# Patient Record
Sex: Male | Born: 1990 | Race: White | Hispanic: No | Marital: Married | Smoking: Former smoker
Health system: Southern US, Community
[De-identification: ages and names within clinical notes are randomized; demographics above are authoritative.]

## PROBLEM LIST (undated history)

## (undated) DIAGNOSIS — K621 Rectal polyp: Secondary | ICD-10-CM

## (undated) DIAGNOSIS — K589 Irritable bowel syndrome without diarrhea: Secondary | ICD-10-CM

## (undated) HISTORY — DX: Irritable bowel syndrome, unspecified: K58.9

---
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
## 2017-05-24 ENCOUNTER — Encounter: Payer: Self-pay | Admitting: Internal Medicine

## 2017-07-18 ENCOUNTER — Ambulatory Visit: Payer: Self-pay | Admitting: Internal Medicine

## 2019-03-05 ENCOUNTER — Emergency Department (HOSPITAL_COMMUNITY): Payer: BLUE CROSS/BLUE SHIELD

## 2019-03-05 ENCOUNTER — Encounter (HOSPITAL_COMMUNITY): Payer: Self-pay | Admitting: Emergency Medicine

## 2019-03-05 ENCOUNTER — Emergency Department (HOSPITAL_COMMUNITY)
Admission: EM | Admit: 2019-03-05 | Discharge: 2019-03-05 | Disposition: A | Discharge status: Home / Self Care | Payer: BLUE CROSS/BLUE SHIELD | Attending: Emergency Medicine | Admitting: Emergency Medicine

## 2019-03-05 ENCOUNTER — Other Ambulatory Visit: Payer: Self-pay

## 2019-03-05 DIAGNOSIS — F17210 Nicotine dependence, cigarettes, uncomplicated: Secondary | ICD-10-CM | POA: Diagnosis not present

## 2019-03-05 DIAGNOSIS — Z79899 Other long term (current) drug therapy: Secondary | ICD-10-CM | POA: Insufficient documentation

## 2019-03-05 DIAGNOSIS — R140 Abdominal distension (gaseous): Secondary | ICD-10-CM | POA: Insufficient documentation

## 2019-03-05 DIAGNOSIS — R112 Nausea with vomiting, unspecified: Secondary | ICD-10-CM | POA: Insufficient documentation

## 2019-03-05 DIAGNOSIS — R6883 Chills (without fever): Secondary | ICD-10-CM | POA: Insufficient documentation

## 2019-03-05 DIAGNOSIS — R197 Diarrhea, unspecified: Secondary | ICD-10-CM | POA: Diagnosis not present

## 2019-03-05 DIAGNOSIS — R1031 Right lower quadrant pain: Secondary | ICD-10-CM | POA: Insufficient documentation

## 2019-03-05 LAB — COMPREHENSIVE METABOLIC PANEL
ALT: 34 U/L (ref 0–44)
AST: 27 U/L (ref 15–41)
Albumin: 4.8 g/dL (ref 3.5–5.0)
Alkaline Phosphatase: 74 U/L (ref 38–126)
Anion gap: 11 (ref 5–15)
BUN: 11 mg/dL (ref 6–20)
CO2: 26 mmol/L (ref 22–32)
Calcium: 9.2 mg/dL (ref 8.9–10.3)
Chloride: 101 mmol/L (ref 98–111)
Creatinine, Ser: 0.78 mg/dL (ref 0.61–1.24)
GFR calc Af Amer: >60 mL/min (ref >60)
GFR calc non Af Amer: >60 mL/min (ref >60)
Glucose, Bld: 114 mg/dL — ABNORMAL HIGH (ref 70–99)
Potassium: 4.2 mmol/L (ref 3.5–5.1)
Sodium: 138 mmol/L (ref 135–145)
Total Bilirubin: 0.9 mg/dL (ref 0.3–1.2)
Total Protein: 7.9 g/dL (ref 6.5–8.1)

## 2019-03-05 LAB — URINALYSIS, ROUTINE W REFLEX MICROSCOPIC
Bilirubin Urine: NEGATIVE
Glucose, UA: NEGATIVE mg/dL
Hgb urine dipstick: NEGATIVE
Ketones, ur: 80 mg/dL — AB
Leukocytes,Ua: NEGATIVE
Nitrite: NEGATIVE
Protein, ur: NEGATIVE mg/dL
Specific Gravity, Urine: 1.023 (ref 1.005–1.030)
pH: 6 (ref 5.0–8.0)

## 2019-03-05 LAB — CBC
HCT: 48.5 % (ref 39.0–52.0)
Hemoglobin: 16.1 g/dL (ref 13.0–17.0)
MCH: 30.4 pg (ref 26.0–34.0)
MCHC: 33.2 g/dL (ref 30.0–36.0)
MCV: 91.7 fL (ref 80.0–100.0)
Platelets: 252 10*3/uL (ref 150–400)
RBC: 5.29 MIL/uL (ref 4.22–5.81)
RDW: 12.4 % (ref 11.5–15.5)
WBC: 24.9 10*3/uL — ABNORMAL HIGH (ref 4.0–10.5)
nRBC: 0 % (ref 0.0–0.2)

## 2019-03-05 LAB — LIPASE, BLOOD: Lipase: 27 U/L (ref 11–51)

## 2019-03-05 MED ORDER — ONDANSETRON 4 MG PO TBDP: 4.0000 mg | ORAL_TABLET | Freq: Three times a day (TID) | ORAL | 0 refills | Status: DC | PRN

## 2019-03-05 MED ORDER — ONDANSETRON HCL 4 MG/2ML IJ SOLN
4.0000 mg | INTRAMUSCULAR | Status: DC | PRN
  Filled 2019-03-05: qty 2

## 2019-03-05 MED ORDER — DICYCLOMINE HCL 20 MG PO TABS: 20.0000 mg | ORAL_TABLET | Freq: Two times a day (BID) | ORAL | 0 refills | Status: DC

## 2019-03-05 MED ORDER — SODIUM CHLORIDE (PF) 0.9 % IJ SOLN
INTRAMUSCULAR | Status: AC
  Filled 2019-03-05: qty 50

## 2019-03-05 MED ORDER — IOHEXOL 300 MG/ML  SOLN
100.0000 mL | Freq: Once | INTRAMUSCULAR | Status: AC | PRN
  Administered 2019-03-05: 100 mL via INTRAVENOUS

## 2019-03-05 NOTE — ED Triage Notes (Signed)
Pt c/o abd pain with n/v/d that started this morning. 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

## 2019-03-05 NOTE — ED Notes (Signed)
Patient offered a beverage. °

## 2019-03-05 NOTE — Discharge Instructions (Signed)
Please read and follow all provided instructions.  Your diagnoses today include:  1. Nausea vomiting and diarrhea   2. Right lower quadrant abdominal pain     Tests performed today include:  Blood counts and electrolytes - very high white blood cell count  Blood tests to check liver and kidney function  Blood tests to check pancreas function  Urine test to look for infection  CT scan - looks good, no appendicitis or other problems  Vital signs. See below for your results today.   Medications prescribed:   Zofran (ondansetron) - for nausea and vomiting   Bentyl - medication for intestinal cramps and spasms  Take any prescribed medications only as directed.  Home care instructions:   Follow any educational materials contained in this packet.   Your abdominal pain, nausea, vomiting, and diarrhea may be caused by a viral gastroenteritis also called 'stomach flu'. You should rest for the next several days. Keep drinking plenty of fluids and use the medicine for nausea as directed.    Drink clear liquids for the next 24 hours and introduce solid foods slowly after 24 hours using the b.r.a.t. diet (Bananas, Rice, Applesauce, Toast, Yogurt).    Follow-up instructions: Please follow-up with your primary care provider in the next 2 days for further evaluation of your symptoms. If you are not feeling better in 48 hours you may have a condition that is more serious and you need re-evaluation.   Return instructions:  SEEK IMMEDIATE MEDICAL ATTENTION IF:  If you have pain that does not go away or becomes severe   A temperature above 101F develops   Repeated vomiting occurs (multiple episodes)   If you have pain that becomes localized to portions of the abdomen. The right side could possibly be appendicitis. In an adult, the left lower portion of the abdomen could be colitis or diverticulitis.   Blood is being passed in stools or vomit (bright red or black tarry stools)   You  develop chest pain, difficulty breathing, dizziness or fainting, or become confused, poorly responsive, or inconsolable (young children)  If you have any other emergent concerns regarding your health  Additional Information: Abdominal (belly) pain can be caused by many things. Your caregiver performed an examination and possibly ordered blood/urine tests and imaging (CT scan, x-rays, ultrasound). Many cases can be observed and treated at home after initial evaluation in the emergency department. Even though you are being discharged home, abdominal pain can be unpredictable. Therefore, you need a repeated exam if your pain does not resolve, returns, or worsens. Most patients with abdominal pain don't have to be admitted to the hospital or have surgery, but serious problems like appendicitis and gallbladder attacks can start out as nonspecific pain. Many abdominal conditions cannot be diagnosed in one visit, so follow-up evaluations are very important.  Your vital signs today were: BP 130/79    Pulse 84    Temp 98.3 F (36.8 C) (Oral)    Resp 17    Ht 5\' 6"  (1.676 m)    Wt 82.6 kg    SpO2 99%    BMI 29.41 kg/m  If your blood pressure (bp) was elevated above 135/85 this visit, please have this repeated by your doctor within one month. --------------

## 2019-03-05 NOTE — ED Provider Notes (Signed)
5:15 PM Handoff from [place] PA-C at shift change.  Patient with elevated white blood cell count in setting of recent onset nausea, vomiting, and diarrhea with right-sided abdominal pain.  CT ordered.  CT images personally reviewed and interpreted.    Radiology reports no acute findings.  Patient symptoms are now relatively controlled and he appears well.  Abdomen is soft and minimally tender.  Patient has passed oral fluid trial and is ready for discharged home.  Will give prescription for Zofran and Bentyl.  Discussed clear liquid and bland diet.  The patient was urged to return to the Emergency Department immediately with worsening of current symptoms, worsening abdominal pain, persistent vomiting, blood noted in stools, fever, or any other concerns. The patient verbalized understanding.   BP 130/79   Pulse 84   Temp 98.3 F (36.8 C) (Oral)   Resp 17   Ht 5\' 6"  (1.676 m)   Wt 82.6 kg   SpO2 99%   BMI 29.41 kg/m     Carlisle Cater, PA-C 03/05/19 1717    Valarie Merino, MD 03/05/19 1901

## 2019-03-05 NOTE — ED Provider Notes (Signed)
[facility] DEPT Provider Note   CSN: [id number] Arrival date & time: 03/05/19  1327    History   Chief Complaint Chief Complaint  Patient presents with  . Abdominal Pain  . Emesis  . Diarrhea    HPI John Phillips is a 28 y.o. male with history of vaping he is here for evaluation of abdominal pain.  Sudden onset upon waking up today, worsening at around 11:30 AM.  Localized to the umbilical area, radiating to the epigastrium and right lateral abdomen and right flank.  Worse with moving, palpation and eating.  Associated with nausea, nonbilious nonbloody emesis and watery diarrhea.  Diarrhea is non-melanotic, looks like [facility]. Some chills.  No interventions.  No alleviating factors.  No abdominal surgeries.  Over the last couple of years he has had some intermittent abdominal bloating and bouts of diarrhea but since starting probiotics this has significantly improved.  He drinks 3-4 beers nightly and smokes marijuana daily.  No frequent NSAID use. No known acid reflux or ulcers. No associated CP, SOB, ST, congestion, cough, dysuria, hematuria.  HPI  History reviewed. No pertinent past medical history.  There are no active problems to display for this patient.   History reviewed. No pertinent surgical history.      Home Medications    Prior to Admission medications   Medication Sig Start Date End Date Taking? Authorizing Provider  Probiotic Product (PROBIOTIC PO) Take 1 tablet by mouth 2 (two) times a day.   Yes [provider]    Family History No family history on file.  Social History Social History   Tobacco Use  . Smoking status: Current Every Day Smoker    Types: Cigarettes  . Smokeless tobacco: Never Used  Substance Use Topics  . Alcohol use: Yes  . Drug use: Not on file     Allergies   Patient has no known allergies.   Review of Systems Review of Systems  Constitutional: Positive for chills.   Gastrointestinal: Positive for abdominal pain, diarrhea, nausea and vomiting.  All other systems reviewed and are negative.    Physical Exam Updated Vital Signs BP 130/79   Pulse 82   Temp 98.3 F (36.8 C) (Oral)   Resp 17   Ht 5\' 6"  (1.676 m)   Wt 82.6 kg   SpO2 99%   BMI 29.41 kg/m   Physical Exam Vitals signs and nursing note reviewed.  Constitutional:      Appearance: He is well-developed.     Comments: Non toxic.  HENT:     Head: Normocephalic and atraumatic.     Nose: Nose normal.  Eyes:     Conjunctiva/sclera: Conjunctivae normal.  Neck:     Musculoskeletal: Normal range of motion.  Cardiovascular:     Rate and Rhythm: Normal rate and regular rhythm.     Heart sounds: Normal heart sounds.     Comments: 1+ radial and DP pulses bilaterally  Pulmonary:     Effort: Pulmonary effort is normal.     Breath sounds: Normal breath sounds.  Abdominal:     General: Bowel sounds are normal.     Palpations: Abdomen is soft.     Tenderness: There is abdominal tenderness. There is guarding.     Comments: Moderate TTP to right mid abdomen, periumbilical area with guarding. Active BS to lower quadrants. No suprapubic or CVA tenderness. No pulsatility. No abdominal hernia.   Musculoskeletal: Normal range of motion.  Skin:  General: Skin is warm and dry.     Capillary Refill: Capillary refill takes less than 2 seconds.  Neurological:     Mental Status: He is alert.  Psychiatric:        Behavior: Behavior normal.      ED Treatments / Results  Labs (all labs ordered are listed, but only abnormal results are displayed) Labs Reviewed  COMPREHENSIVE METABOLIC PANEL - Abnormal; Notable for the following components:      Result Value   Glucose, Bld 114 (*)    All other components within normal limits  CBC - Abnormal; Notable for the following components:   WBC 24.9 (*)    All other components within normal limits  URINALYSIS, ROUTINE W REFLEX MICROSCOPIC - Abnormal;  Notable for the following components:   Ketones, ur 80 (*)    All other components within normal limits  LIPASE, BLOOD    EKG None  Radiology Ct Abdomen Pelvis W Contrast  Result Date: 03/05/2019 CLINICAL DATA:  Abdominal pain with nausea, vomiting and diarrhea beginning this morning. EXAM: CT ABDOMEN AND PELVIS WITH CONTRAST TECHNIQUE: Multidetector CT imaging of the abdomen and pelvis was performed using the standard protocol following bolus administration of intravenous contrast. CONTRAST:  19mL OMNIPAQUE IOHEXOL 300 MG/ML  SOLN COMPARISON:  None. FINDINGS: Lower chest: Normal. Hepatobiliary: Liver, gallbladder and biliary tree are normal. Pancreas: Normal. Spleen: Normal. Adrenals/Urinary Tract: Adrenal glands are normal. Kidneys normal size without hydronephrosis or nephrolithiasis. Ureters and bladder are normal. Stomach/Bowel: Stomach and small bowel are normal. Appendix is normal. Colon is unremarkable. Vascular/Lymphatic: Normal. Reproductive: Normal. Other: No free fluid or focal inflammatory change. Musculoskeletal: Normal. IMPRESSION: No acute findings in the abdomen/pelvis. Electronically Signed   By: Marin Olp M.D.   On: 03/05/2019 16:14    Procedures Procedures (including critical care time)  Medications Ordered in ED Medications  ondansetron (ZOFRAN) injection 4 mg (has no administration in time range)  sodium chloride (PF) 0.9 % injection (has no administration in time range)  iohexol (OMNIPAQUE) 300 MG/ML solution 100 mL (100 mLs Intravenous Contrast Given 03/05/19 1557)     Initial Impression / Assessment and Plan / ED Course  I have reviewed the triage vital signs and the nursing notes.  Pertinent labs & imaging results that were available during my care of the patient were reviewed by me and considered in my medical decision making (see chart for details).  Clinical Course as of Mar 04 1625  Mon Mar 05, 2019  1501 WBC(!): 24.9 [CG]  1615 Ketones, ur(!): 80  [CG]  1616 WBC(!): 24.9 [CG]    Clinical Course User Index [CG] Kinnie Feil, PA-C       TTP to umbilicus, right mid abdomen with guarding. Leukocytosis. 80 ketones in urine possibly from dehydration. Other than leukocytosis no other SIRS criteria.   ddx includes viral gastroenteritis.  Given umbilical and right mid abd could be early appendicitis vs cholecystitis. Heavy nightly ETOH use possibly pancreatitis. Lower suspicion for SBO, dissection, AAA, renal etiology. Pending CTAP   Final Clinical Impressions(s) / ED Diagnoses   1620: Handed pt off to oncoming EDPA who will f/u on CTPA and determine disposition.  Final diagnoses:  None    ED Discharge Orders    None       Arlean Hopping 03/05/19 1626    Veryl Speak, MD 03/08/19 [phone]

## 2019-07-09 ENCOUNTER — Other Ambulatory Visit (INDEPENDENT_AMBULATORY_CARE_PROVIDER_SITE_OTHER): Payer: BC Managed Care – PPO

## 2019-07-09 ENCOUNTER — Ambulatory Visit: Payer: BC Managed Care – PPO | Admitting: Internal Medicine

## 2019-07-09 ENCOUNTER — Encounter: Payer: Self-pay | Admitting: Internal Medicine

## 2019-07-09 VITALS — BP 112/66 | HR 70 | Temp 98.3°F | Ht 66.0 in | Wt 186.8 lb

## 2019-07-09 DIAGNOSIS — D72829 Elevated white blood cell count, unspecified: Secondary | ICD-10-CM | POA: Diagnosis not present

## 2019-07-09 DIAGNOSIS — R197 Diarrhea, unspecified: Secondary | ICD-10-CM

## 2019-07-09 DIAGNOSIS — R1084 Generalized abdominal pain: Secondary | ICD-10-CM

## 2019-07-09 LAB — CBC WITH DIFFERENTIAL/PLATELET
Basophils Absolute: 0.1 10*3/uL (ref 0.0–0.1)
Basophils Relative: 0.7 % (ref 0.0–3.0)
Eosinophils Absolute: 0.2 10*3/uL (ref 0.0–0.7)
Eosinophils Relative: 2.8 % (ref 0.0–5.0)
HCT: 45.7 % (ref 39.0–52.0)
Hemoglobin: 15.6 g/dL (ref 13.0–17.0)
Lymphocytes Relative: 28.3 % (ref 12.0–46.0)
Lymphs Abs: 2.3 10*3/uL (ref 0.7–4.0)
MCHC: 34 g/dL (ref 30.0–36.0)
MCV: 89.3 fl (ref 78.0–100.0)
Monocytes Absolute: 0.6 10*3/uL (ref 0.1–1.0)
Monocytes Relative: 6.8 % (ref 3.0–12.0)
Neutro Abs: 5.1 10*3/uL (ref 1.4–7.7)
Neutrophils Relative %: 61.4 % (ref 43.0–77.0)
Platelets: 278 10*3/uL (ref 150.0–400.0)
RBC: 5.12 Mil/uL (ref 4.22–5.81)
RDW: 13.1 % (ref 11.5–15.5)
WBC: 8.2 10*3/uL (ref 4.0–10.5)

## 2019-07-09 LAB — SEDIMENTATION RATE: Sed Rate: 6 mm/hr (ref 0–15)

## 2019-07-09 LAB — C-REACTIVE PROTEIN: CRP: <1 mg/dL (ref 0.5–20.0)

## 2019-07-09 MED ORDER — DICYCLOMINE HCL 20 MG PO TABS: 20.0000 mg | ORAL_TABLET | Freq: Four times a day (QID) | ORAL | 0 refills | Status: DC | PRN

## 2019-07-09 NOTE — Progress Notes (Signed)
   John Phillips 28 y.o. 1990/12/03 [id number]  Assessment & Plan:   Encounter Diagnoses  Name Primary?  . Diarrhea, unspecified type Yes  . Generalized abdominal pain   . Leukocytosis, unspecified type     Cause of his problems not clear, main differential would be irritable bowel syndrome versus inflammatory bowel disease.  Will evaluate with colonoscopy.  CBC, sed rate C-reactive protein today.  Further plans pending these results.  Dicyclomine as needed for now.  The risks and benefits as well as alternatives of endoscopic procedure(s) have been discussed and reviewed. All questions answered. The patient agrees to proceed.     Subjective:   Chief Complaint: Diarrhea  HPI The patient is a 28 year old married white man who has a 3-year history of intermittent diarrhea and bloating problems.  Began about 3 years ago, and is perhaps a bit more frequent.  He had a severe episode and went to the emergency department in April where he had a white blood cell count greater than 20,000 but a normal CT of the abdomen and pelvis.  He took dicyclomine after that visit and said that that helped.  He gets these episodes without clear triggers though spicy foods seem to bother it and he has stopped those and is a little bit better lately, and he will be bloated and distended and have watery diarrhea and sometimes abdominal cramping.  Nausea and vomiting is rare but was present with a severe episode which prompted emergency department visit.  No bleeding.  No family history of inflammatory bowel disease.  He does not have mouth ulcers rashes arthritis eye problems.  GI review of systems is otherwise negative.  He does smoke cigarettes vape and use marijuana and drinks about 3 drinks a day.  Wt Readings from Last 3 Encounters:  07/09/19 186 lb 12.8 oz (84.7 kg)  03/05/19 182 lb 3 oz (82.6 kg)    No Known Allergies No outpatient medications have been marked as taking for the 07/09/19 encounter  (Office Visit) with Gatha Mayer, MD.   History reviewed. No pertinent past medical history. History reviewed. No pertinent surgical history. Social History   Social History Narrative   Married   1 son born 03/2018   Works for [employer]   3 alcoholic drinks a day, he smokes cigarettes he vapes and he does use marijuana   family history includes Pancreatitis in his father.   Review of Systems As per HPI.  All other review of systems are negative at this time.  Objective:   Physical Exam @BP  112/66   Pulse 70   Temp 98.3 F (36.8 C) (Temporal)   Ht 5\' 6"  (1.676 m)   Wt 186 lb 12.8 oz (84.7 kg)   BMI 30.15 kg/m @  General:  Well-developed, well-nourished and in no acute distress Eyes:  anicteric. ENT:   Mouth and posterior pharynx free of lesions.  Neck:   supple w/o thyromegaly or mass.  Lungs: Clear to auscultation bilaterally. Heart:  S1S2, no rubs, murmurs, gallops. Abdomen:  soft, non-tender, no hepatosplenomegaly, hernia, or mass and BS+.  Rectal: deferred until colonoscopy Lymph:  no cervical or supraclavicular adenopathy. Extremities:   no edema, cyanosis or clubbing Skin   no rash. tattoos Neuro:  A&O x 3.  Psych:  appropriate mood and  Affect.   Data Reviewed:  See HPI

## 2019-07-09 NOTE — Patient Instructions (Addendum)
You have been scheduled for a colonoscopy. Please follow written instructions given to you at your visit today.  Please pick up your prep supplies at the pharmacy within the next 1-3 days. If you use inhalers (even only as needed), please bring them with you on the day of your procedure. Your physician has requested that you go to www.startemmi.com and enter the access code given to you at your visit today. This web site gives a general overview about your procedure. However, you should still follow specific instructions given to you by our office regarding your preparation for the procedure.   We have sent the following medications to your pharmacy for you to pick up at your convenience: Welcome provider has requested that you go to the basement level for lab work before leaving today. Press "B" on the elevator. The lab is located at the first door on the left as you exit the elevator.   I appreciate the opportunity to care for you. Silvano Rusk, MD, [facility]

## 2019-07-10 NOTE — Progress Notes (Signed)
Labs are fine Please let him know

## 2019-07-13 ENCOUNTER — Encounter: Payer: Self-pay | Admitting: Internal Medicine

## 2019-07-23 ENCOUNTER — Other Ambulatory Visit: Payer: Self-pay

## 2019-07-23 ENCOUNTER — Ambulatory Visit (AMBULATORY_SURGERY_CENTER): Payer: BC Managed Care – PPO | Admitting: Internal Medicine

## 2019-07-23 ENCOUNTER — Encounter: Payer: Self-pay | Admitting: Internal Medicine

## 2019-07-23 VITALS — BP 106/69 | HR 80 | Temp 98.6°F | Resp 22 | Ht 66.0 in | Wt 186.0 lb

## 2019-07-23 DIAGNOSIS — R197 Diarrhea, unspecified: Secondary | ICD-10-CM | POA: Diagnosis not present

## 2019-07-23 DIAGNOSIS — K621 Rectal polyp: Secondary | ICD-10-CM | POA: Diagnosis not present

## 2019-07-23 DIAGNOSIS — K635 Polyp of colon: Secondary | ICD-10-CM | POA: Diagnosis not present

## 2019-07-23 DIAGNOSIS — D128 Benign neoplasm of rectum: Secondary | ICD-10-CM

## 2019-07-23 HISTORY — PX: COLONOSCOPY W/ BIOPSIES: SHX1374

## 2019-07-23 MED ORDER — SODIUM CHLORIDE 0.9 % IV SOLN: 500.0000 mL | Freq: Once | INTRAVENOUS | Status: DC

## 2019-07-23 NOTE — Op Note (Signed)
[facility] Patient Name: John Phillips Procedure Date: 07/23/2019 2:59 PM MRN: [id number] Endoscopist: Gatha Mayer , MD Age: 28 Referring MD:  Date of Birth: [birth year] Gender: Male Account #: [id number] Procedure:                Colonoscopy Indications:              Chronic diarrhea, Clinically significant diarrhea                            of unexplained origin Medicines:                Propofol per Anesthesia, Monitored Anesthesia Care Procedure:                Pre-Anesthesia Assessment:                           - Prior to the procedure, a History and Physical                            was performed, and patient medications and                            allergies were reviewed. The patient's tolerance of                            previous anesthesia was also reviewed. The risks                            and benefits of the procedure and the sedation                            options and risks were discussed with the patient.                            All questions were answered, and informed consent                            was obtained. Prior Anticoagulants: The patient has                            taken no previous anticoagulant or antiplatelet                            agents. ASA Grade Assessment: I - A normal, healthy                            patient. After reviewing the risks and benefits,                            the patient was deemed in satisfactory condition to                            undergo the procedure.  After obtaining informed consent, the colonoscope                            was passed under direct vision. Throughout the                            procedure, the patient's blood pressure, pulse, and                            oxygen saturations were monitored continuously. The                            Colonoscope was introduced through the anus and                            advanced to the the terminal  ileum, with                            identification of the appendiceal orifice and IC                            valve. The colonoscopy was performed without                            difficulty. The patient tolerated the procedure                            well. The quality of the bowel preparation was                            good. The bowel preparation used was Miralax via                            split dose instruction. The terminal ileum,                            ileocecal valve, appendiceal orifice, and rectum                            were photographed. Scope In: 3:04:51 PM Scope Out: 3:17:27 PM Scope Withdrawal Time: 0 hours 10 minutes 18 seconds  Total Procedure Duration: 0 hours 12 minutes 36 seconds  Findings:                 The perianal and digital rectal examinations were                            normal.                           Patchy inflammation, mild in severity and                            characterized by erythema and granularity was found  in the terminal ileum. Biopsies were taken with a                            cold forceps for histology. Verification of patient                            identification for the specimen was done. Estimated                            blood loss was minimal.                           A 1 to 2 mm polyp was found in the rectum. The                            polyp was sessile. The polyp was removed with a                            cold biopsy forceps. Resection and retrieval were                            complete. Verification of patient identification                            for the specimen was done. Estimated blood loss was                            minimal.                           The exam was otherwise without abnormality on                            direct and retroflexion views.                           Biopsies for histology were taken with a cold                             forceps from the ascending colon, transverse colon,                            descending colon, sigmoid colon and rectum for                            evaluation of microscopic colitis. Estimated blood                            loss was minimal. Complications:            No immediate complications. Estimated Blood Loss:     Estimated blood loss was minimal. Impression:               - Ileitis, rule out Crohn's disease. Biopsied.                           -  One 1 to 2 mm polyp in the rectum, removed with a                            cold biopsy forceps. Resected and retrieved.                           - The examination was otherwise normal on direct                            and retroflexion views.                           - Biopsies were taken with a cold forceps from the                            ascending colon, transverse colon, descending                            colon, sigmoid colon and rectum for evaluation of                            microscopic colitis. Recommendation:           - Patient has a contact number available for                            emergencies. The signs and symptoms of potential                            delayed complications were discussed with the                            patient. Return to normal activities tomorrow.                            Written discharge instructions were provided to the                            patient.                           - Resume previous diet.                           - Continue present medications.                           - Await pathology results.                           - No recommendation at this time regarding repeat                            colonoscopy due to young age. Gatha Mayer, MD 07/23/2019 3:28:19 PM This report has been signed electronically.

## 2019-07-23 NOTE — Progress Notes (Signed)
Report given to PACU, vss 

## 2019-07-23 NOTE — Patient Instructions (Addendum)
No obvious changes here.  There was a suggestion of inflammation in the ileum - end of the small intestine. Sometimes that is from the prep. I took biopsies of that and also the colon - which looked normal.  One tiny rectal polyp that I am sure is benign was removed.  Once I see pathology results will contact you .  I appreciate the opportunity to care for you.  Gatha Mayer, MD, [facility]  Please read handouts provided. Continue present medications. Await pathology results.     YOU HAD AN ENDOSCOPIC PROCEDURE TODAY AT [facility]:   Refer to the procedure report that was given to you for any specific questions about what was found during the examination.  If the procedure report does not answer your questions, please call your gastroenterologist to clarify.  If you requested that your care partner not be given the details of your procedure findings, then the procedure report has been included in a sealed envelope for you to review at your convenience later.  YOU SHOULD EXPECT: Some feelings of bloating in the abdomen. Passage of more gas than usual.  Walking can help get rid of the air that was put into your GI tract during the procedure and reduce the bloating. If you had a lower endoscopy (such as a colonoscopy or flexible sigmoidoscopy) you may notice spotting of blood in your stool or on the toilet paper. If you underwent a bowel prep for your procedure, you may not have a normal bowel movement for a few days.  Please Note:  You might notice some irritation and congestion in your nose or some drainage.  This is from the oxygen used during your procedure.  There is no need for concern and it should clear up in a day or so.  SYMPTOMS TO REPORT IMMEDIATELY:   Following lower endoscopy (colonoscopy or flexible sigmoidoscopy):  Excessive amounts of blood in the stool  Significant tenderness or worsening of abdominal pains  Swelling of the abdomen that is new,  acute  Fever of 100F or higher   For urgent or emergent issues, a gastroenterologist can be reached at any hour by calling [phone].   DIET:  We do recommend a small meal at first, but then you may proceed to your regular diet.  Drink plenty of fluids but you should avoid alcoholic beverages for 24 hours.  ACTIVITY:  You should plan to take it easy for the rest of today and you should NOT DRIVE or use heavy machinery until tomorrow (because of the sedation medicines used during the test).    FOLLOW UP: Our staff will call the number listed on your records 48-72 hours following your procedure to check on you and address any questions or concerns that you may have regarding the information given to you following your procedure. If we do not reach you, we will leave a message.  We will attempt to reach you two times.  During this call, we will ask if you have developed any symptoms of COVID 19. If you develop any symptoms (ie: fever, flu-like symptoms, shortness of breath, cough etc.) before then, please call [phone].  If you test positive for Covid 19 in the 2 weeks post procedure, please call and report this information to [place].    If any biopsies were taken you will be contacted by phone or by letter within the next 1-3 weeks.  Please call us at [phone] if you have not heard about  the biopsies in 3 weeks.    SIGNATURES/CONFIDENTIALITY: You and/or your care partner have signed paperwork which will be entered into your electronic medical record.  These signatures attest to the fact that that the information above on your After Visit Summary has been reviewed and is understood.  Full responsibility of the confidentiality of this discharge information lies with you and/or your care-partner.

## 2019-07-23 NOTE — Progress Notes (Addendum)
Pt's states no medical or surgical changes since previsit or office visit.  No egg or soy allergy    jb temp cw vitals

## 2019-07-23 NOTE — Progress Notes (Signed)
Called to room to assist during endoscopic procedure.  Patient ID and intended procedure confirmed with present staff. Received instructions for my participation in the procedure from the performing physician.  

## 2019-07-25 ENCOUNTER — Telehealth: Payer: Self-pay

## 2019-07-25 ENCOUNTER — Telehealth: Payer: Self-pay | Admitting: Internal Medicine

## 2019-07-25 NOTE — Telephone Encounter (Signed)
Letter faxed.

## 2019-07-25 NOTE — Telephone Encounter (Signed)
  Follow up Call-  Call back number 07/23/2019  Post procedure Call Back phone  # [phone]  Permission to leave phone message Yes  Some recent data might be hidden     Patient questions:  Do you have a fever, pain , or abdominal swelling? No. Pain Score  0 *  Have you tolerated food without any problems? Yes.    Have you been able to return to your normal activities? Yes.    Do you have any questions about your discharge instructions: Diet   No. Medications  No. Follow up visit  No.  Do you have questions or concerns about your Care? No.  Actions: * If pain score is 4 or above: No action needed, pain <4.  1. Have you developed a fever since your procedure? no  2.   Have you had an respiratory symptoms (SOB or cough) since your procedure? no  3.   Have you tested positive for COVID 19 since your procedure no  4.   Have you had any family members/close contacts diagnosed with the COVID 19 since your procedure?  no   If yes to any of these questions please route to Joylene Fillmore, RN and Alphonsa Gin, Therapist, sports.

## 2019-09-03 ENCOUNTER — Ambulatory Visit (INDEPENDENT_AMBULATORY_CARE_PROVIDER_SITE_OTHER): Payer: BC Managed Care – PPO | Admitting: Internal Medicine

## 2019-09-03 ENCOUNTER — Encounter: Payer: Self-pay | Admitting: Internal Medicine

## 2019-09-03 ENCOUNTER — Other Ambulatory Visit (INDEPENDENT_AMBULATORY_CARE_PROVIDER_SITE_OTHER): Payer: BC Managed Care – PPO

## 2019-09-03 VITALS — BP 112/82 | HR 97 | Temp 97.8°F | Ht 66.0 in | Wt 191.5 lb

## 2019-09-03 DIAGNOSIS — K580 Irritable bowel syndrome with diarrhea: Secondary | ICD-10-CM

## 2019-09-03 DIAGNOSIS — K529 Noninfective gastroenteritis and colitis, unspecified: Secondary | ICD-10-CM

## 2019-09-03 LAB — IGA: IgA: 240 mg/dL (ref 68–378)

## 2019-09-03 NOTE — Patient Instructions (Signed)
Your provider has requested that you go to the basement level for lab work before leaving today. Press "B" on the elevator. The lab is located at the first door on the left as you exit the elevator.   If your test are negative we want you to do the breath test. We are giving you a kit today so you won't have to come back.   You have been given a testing kit to check for small intestine bacterial overgrowth (SIBO) which is completed by a company named [facility]. Make sure to return your test in the mail using the return mailing label given you along with the kit. Your demographic and insurance information have already been sent to the company and they should be in contact with you over the next week regarding this test. Please keep in mind that you will be getting a call from phone number [phone] or a similar number. If you do not hear from them within this time frame, please call our office at [phone].    I appreciate the opportunity to care for you. Carl Gessner, MD, FACG 

## 2019-09-03 NOTE — Assessment & Plan Note (Addendum)
Continue dicyclomine for now.  See chronic diarrhea assessment and plan

## 2019-09-03 NOTE — Progress Notes (Signed)
   Shalin Drewek Lorson 28 y.o. 03-Dec-1990 [id number]  Assessment & Plan:   Chronic diarrhea I think for the most part inflammatory bowel disease has been well ruled out though we could do things like a capsule study or serologies I will think it is worth it.  I want to exclude celiac disease and if that is negative we have decided to pursue lactulose hydrogen breath test for small intestinal bacterial overgrowth.  We did discuss the possibility of empiric Xifaxan and he prefers the test and treat approach.  Irritable bowel syndrome with diarrhea Continue dicyclomine for now.  See chronic diarrhea assessment and plan     Subjective:   Chief Complaint: Diarrhea and abdominal cramps  HPI The patient returns, colonoscopy in September was unrevealing regarding chronic diarrhea.  He is managing with dicyclomine but if he does not take it he will have a bad day.  He was unable to retrieve it from the work truck 1 day and had a pretty bad spell.  Wt Readings from Last 3 Encounters:  09/03/19 191 lb 8 oz (86.9 kg)  07/23/19 186 lb (84.4 kg)  07/09/19 186 lb 12.8 oz (84.7 kg)   He has not correlated it with alcohol or any other food.  Recall that inflammatory markers CBC chemistries all negative in the past as well as CT scan of the abdomen and pelvis in the emergency department. No Known Allergies Current Meds  Medication Sig  . dicyclomine (BENTYL) 20 MG tablet Take 1 tablet (20 mg total) by mouth every 6 (six) hours as needed for spasms.  Marland Kitchen ibuprofen (ADVIL) 200 MG tablet Take 400 mg by mouth every 6 (six) hours as needed.  . Probiotic Product (PROBIOTIC PO) Take 1 tablet by mouth 2 (two) times a day.   Past Medical History:  Diagnosis Date  . IBS (irritable bowel syndrome)    Past Surgical History:  Procedure Laterality Date  . COLONOSCOPY W/ BIOPSIES  07/23/2019   Social History   Social History Narrative   Married   1 son born 03/2018   Works for [employer]   3 alcoholic  drinks a day, he smokes cigarettes he vapes and he does use marijuana   family history includes Leukemia in his paternal grandfather; Pancreatitis in his father.   Review of Systems As above  Objective:   Physical Exam BP 112/82 (BP Location: Left Arm, Patient Position: Sitting, Cuff Size: Normal)   Pulse 97   Temp 97.8 F (36.6 C) (Other (Comment))   Ht 5\' 6"  (1.676 m)   Wt 191 lb 8 oz (86.9 kg)   BMI 30.91 kg/m  No acute distress  15 minutes time spent with patient > half in counseling coordination of care

## 2019-09-03 NOTE — Assessment & Plan Note (Signed)
I think for the most part inflammatory bowel disease has been well ruled out though we could do things like a capsule study or serologies I will think it is worth it.  I want to exclude celiac disease and if that is negative we have decided to pursue lactulose hydrogen breath test for small intestinal bacterial overgrowth.  We did discuss the possibility of empiric Xifaxan and he prefers the test and treat approach.

## 2019-09-04 LAB — TISSUE TRANSGLUTAMINASE, IGA: (tTG) Ab, IgA: 1 U/mL

## 2019-09-07 NOTE — Progress Notes (Signed)
Celiac testing is negative  He has a lactulose H2 breath test for SIBO and should do that  Will contact when results are in

## 2019-09-18 ENCOUNTER — Other Ambulatory Visit: Payer: Self-pay | Admitting: Internal Medicine

## 2019-09-18 NOTE — Telephone Encounter (Signed)
May I refill Sir? 

## 2019-10-31 ENCOUNTER — Other Ambulatory Visit: Payer: Self-pay | Admitting: Internal Medicine

## 2019-11-07 ENCOUNTER — Telehealth: Payer: Self-pay

## 2019-11-07 NOTE — Telephone Encounter (Signed)
Left detailed message for [name] to call me back. I want to make sure he has a breath test kit as discussed at the last visit on 09/03/2019.

## 2019-12-04 ENCOUNTER — Other Ambulatory Visit: Payer: Self-pay | Admitting: Internal Medicine

## 2020-01-22 ENCOUNTER — Other Ambulatory Visit: Payer: Self-pay | Admitting: Internal Medicine

## 2020-03-11 ENCOUNTER — Other Ambulatory Visit: Payer: Self-pay | Admitting: Internal Medicine

## 2020-04-21 ENCOUNTER — Other Ambulatory Visit: Payer: Self-pay | Admitting: Internal Medicine

## 2020-06-08 ENCOUNTER — Other Ambulatory Visit: Payer: Self-pay | Admitting: Internal Medicine

## 2020-08-08 ENCOUNTER — Other Ambulatory Visit: Payer: Self-pay | Admitting: Internal Medicine

## 2020-10-03 ENCOUNTER — Other Ambulatory Visit: Payer: Self-pay | Admitting: Internal Medicine

## 2020-11-28 IMAGING — CT CT ABDOMEN AND PELVIS WITH CONTRAST
2 of 4 series · 17 of 46 positions shown, 19 images · IV contrast (ISOVUE)
Comparison: None.

CLINICAL DATA: Abdominal pain with nausea, vomiting and diarrhea
beginning this morning.

EXAM:
CT ABDOMEN AND PELVIS WITH CONTRAST
TECHNIQUE: Multidetector CT imaging of the abdomen and pelvis was performed
using the standard protocol following bolus administration of
intravenous contrast.
CONTRAST:  100mL OMNIPAQUE IOHEXOL 300 MG/ML  SOLN

[Series 2: axial st · axial · 0.74mm/px · z∈[-531,-161]mm · 14 of 84 slices shown, 16 images]
[im 5/84  soft-tissue]
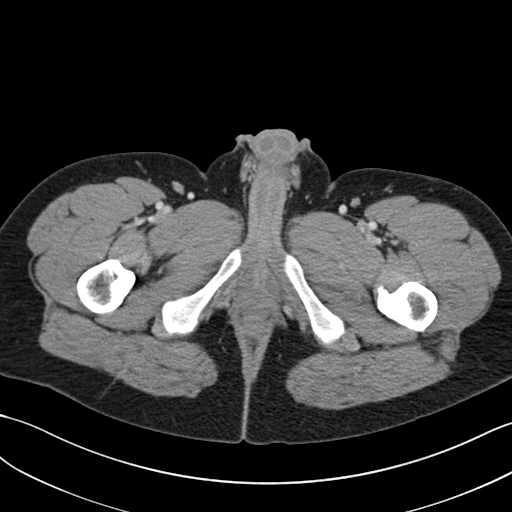
[im 5/84  bone]
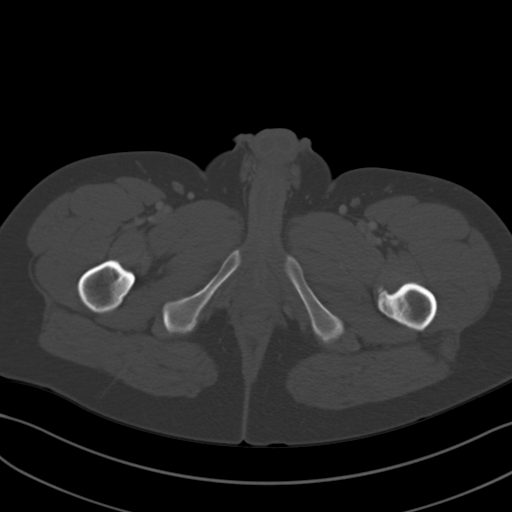
[im 10/84  soft-tissue]
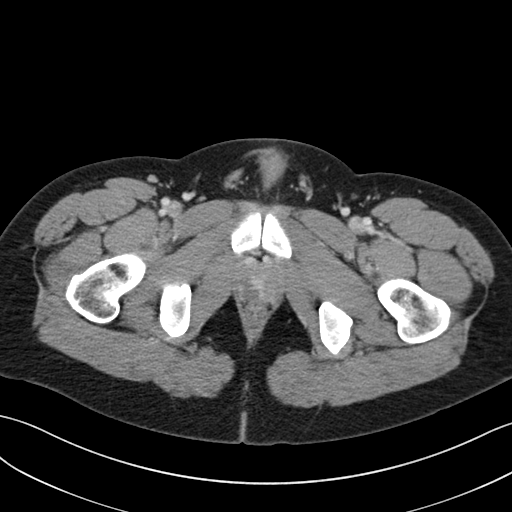
[im 15/84  soft-tissue]
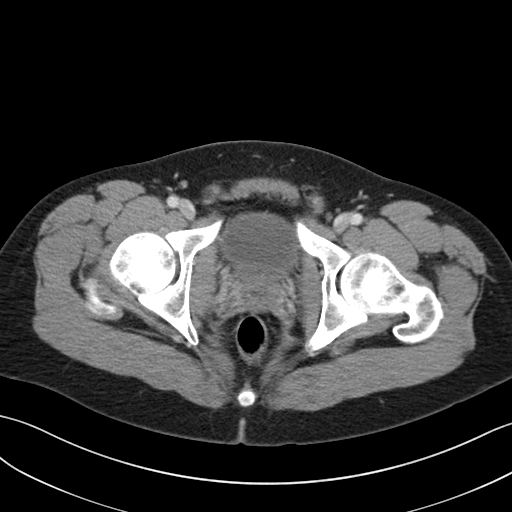
[im 25/84  soft-tissue]
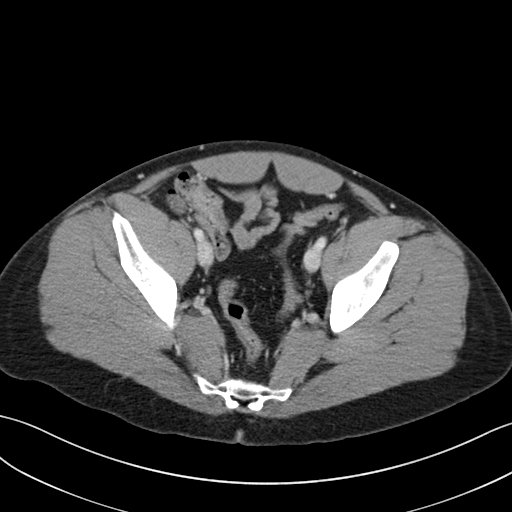
[im 30/84  soft-tissue]
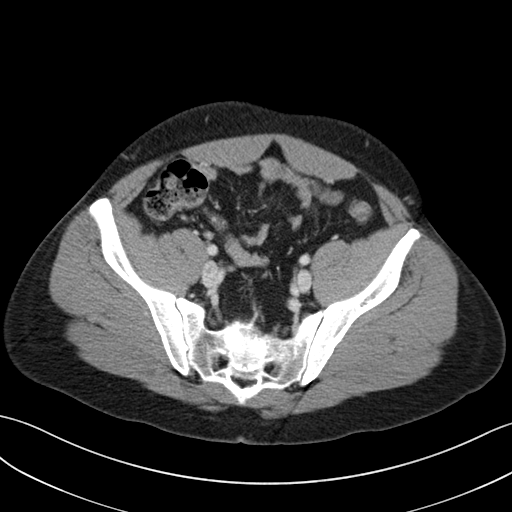
[im 35/84  soft-tissue]
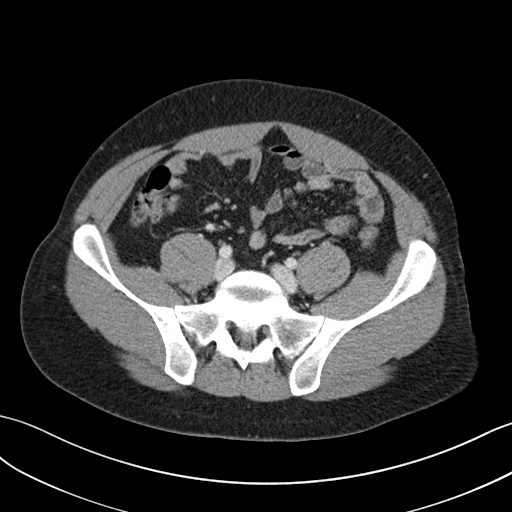
[im 40/84  soft-tissue]
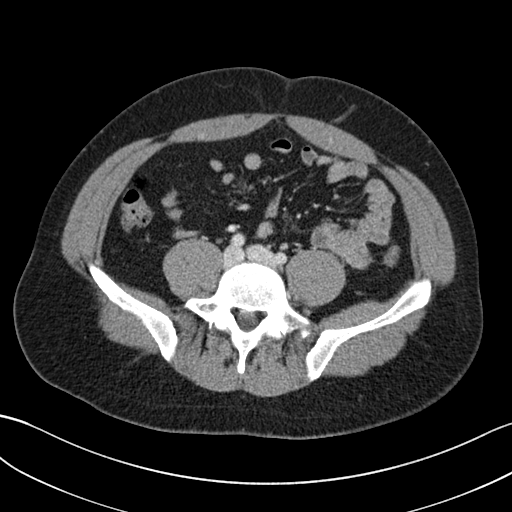
[im 44/84  soft-tissue]
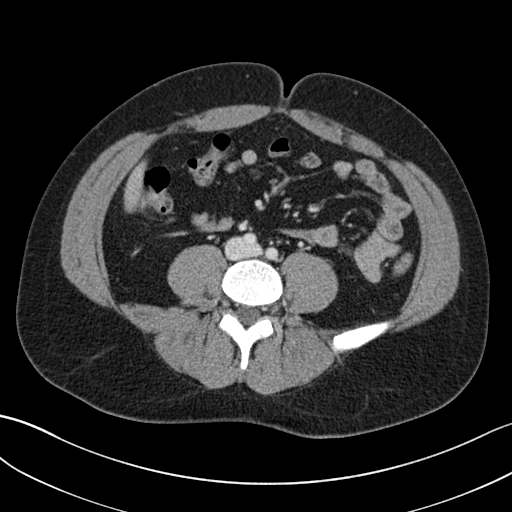
[im 49/84  soft-tissue]
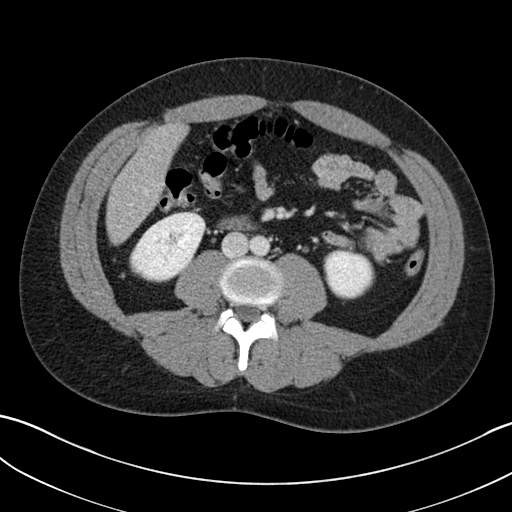
[im 49/84  bone]
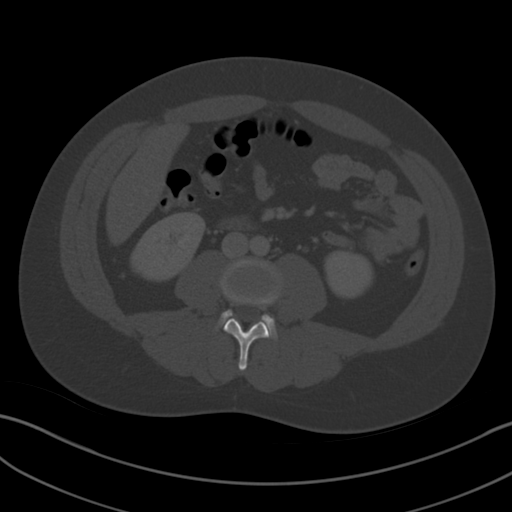
[im 54/84  soft-tissue]
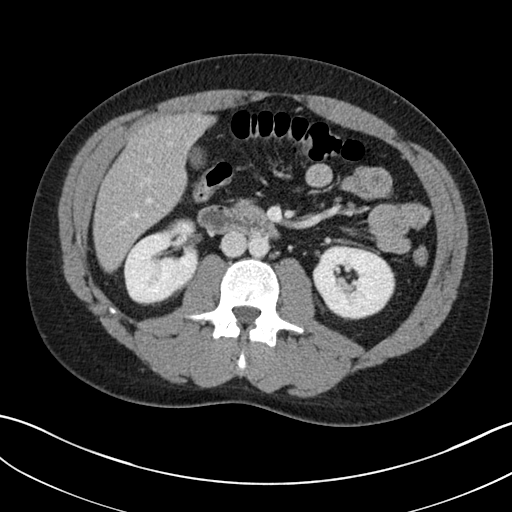
[im 64/84  soft-tissue]
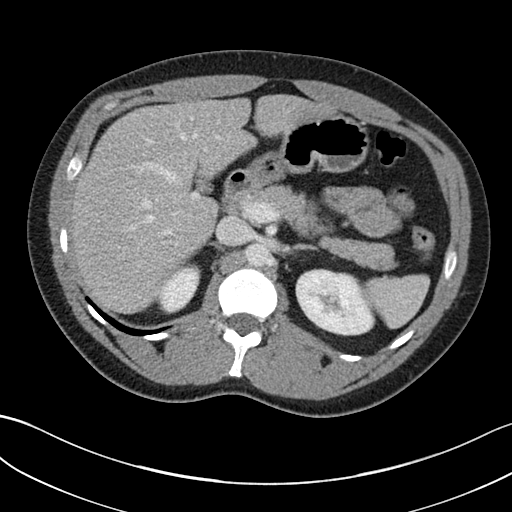
[im 69/84  soft-tissue]
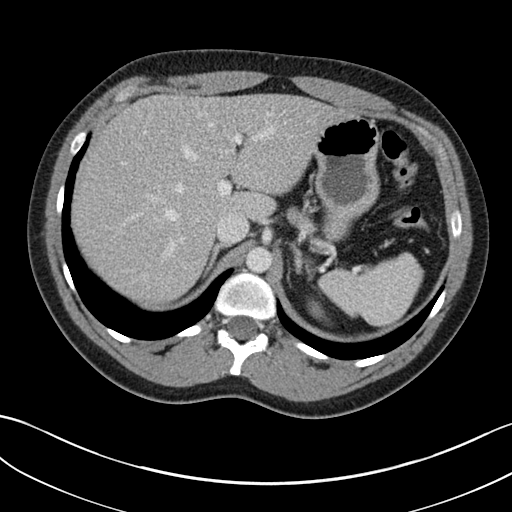
[im 74/84  soft-tissue]
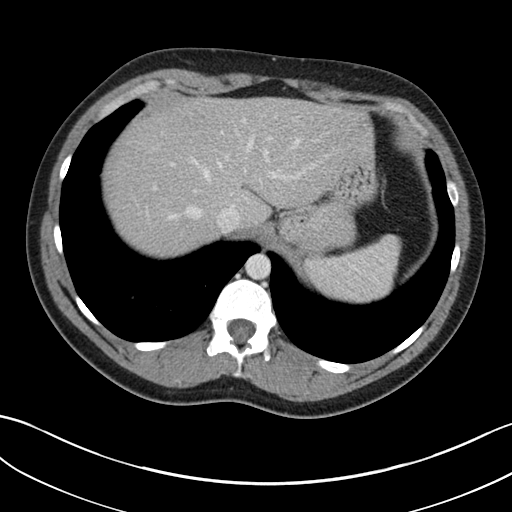
[im 79/84  soft-tissue]
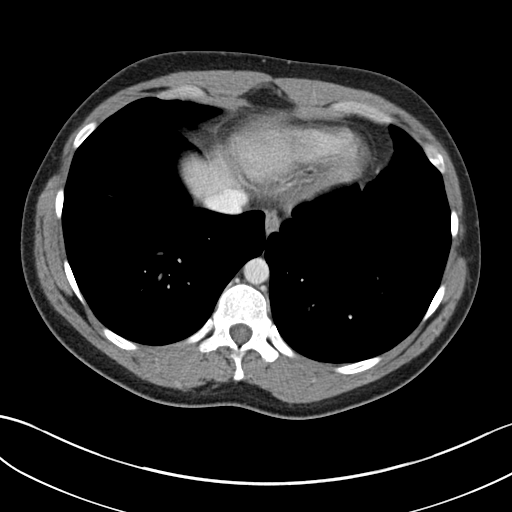

[Series 5: coronal st · coronal · 0.73mm/px · 3 of 138 slices shown]
[im 46/138  soft-tissue]
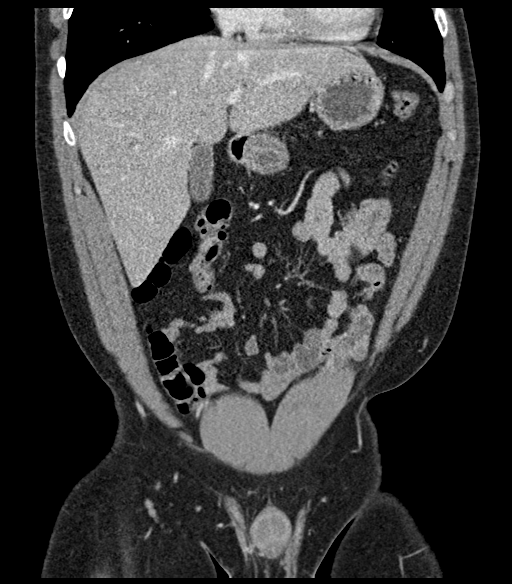
[im 61/138  soft-tissue]
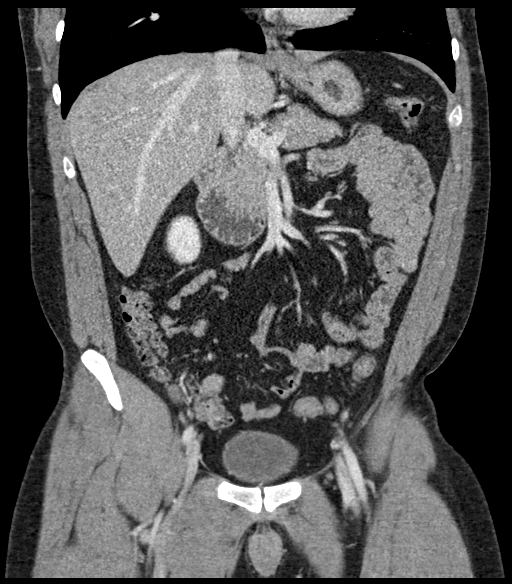
[im 77/138  soft-tissue]
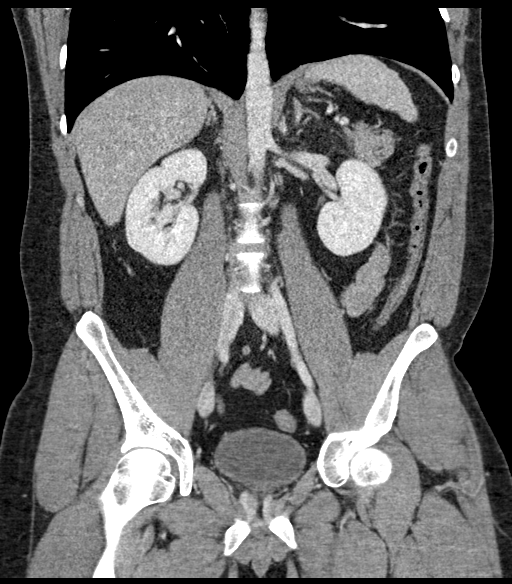

[17 of 46 positions shown; findings below may reference images not displayed]

FINDINGS: Lower chest: Normal.

Hepatobiliary: Liver, gallbladder and biliary tree are normal.

Pancreas: Normal.

Spleen: Normal.

Adrenals/Urinary Tract: Adrenal glands are normal. Kidneys normal
size without hydronephrosis or nephrolithiasis. Ureters and bladder
are normal.

Stomach/Bowel: Stomach and small bowel are normal. Appendix is
normal. Colon is unremarkable.

Vascular/Lymphatic: Normal.

Reproductive: Normal.

Other: No free fluid or focal inflammatory change.

Musculoskeletal: Normal.
IMPRESSION: No acute findings in the abdomen/pelvis.
